# Patient Record
Sex: Male | Born: 1960 | Race: Black or African American | Hispanic: No | Marital: Married | Smoking: Never smoker
Health system: Southern US, Community
[De-identification: ages and names within clinical notes are randomized; demographics above are authoritative.]

## PROBLEM LIST (undated history)

## (undated) DIAGNOSIS — I10 Essential (primary) hypertension: Secondary | ICD-10-CM

---

## 1994-12-14 HISTORY — PX: VASECTOMY: SHX75

## 2010-05-22 ENCOUNTER — Encounter: Admission: RE | Admit: 2010-05-22 | Discharge: 2010-05-22 | Payer: Self-pay | Admitting: Internal Medicine

## 2011-05-24 IMAGING — CT CT HEAD W/O CM
2 series · 15 of 30 positions shown, 19 images · non-contrast
Comparison: None.

CLINICAL DATA: Right-sided headaches and dizziness.

CT HEAD WITHOUT CONTRAST
TECHNIQUE: Contiguous axial images were obtained from the base of
the skull through the vertex without contrast.

[Series 3: head bone · axial · 0.49mm/px · z∈[+26,+46]mm · 2 of 32 slices shown]
[im 3/32  bone]
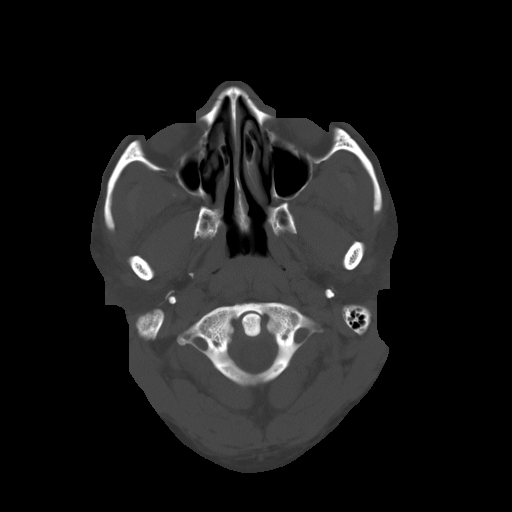
[im 7/32  bone]
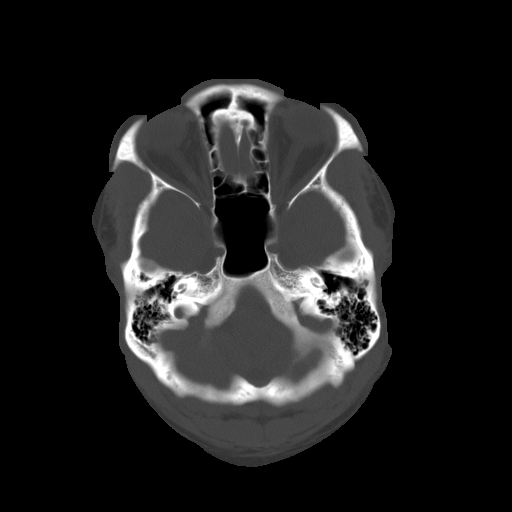

[Series 32: 3d filtered head w/o · axial · non-contrast · 0.49mm/px · z∈[+26,+158]mm · 13 of 32 slices shown, 17 images]
[im 3/32  brain]
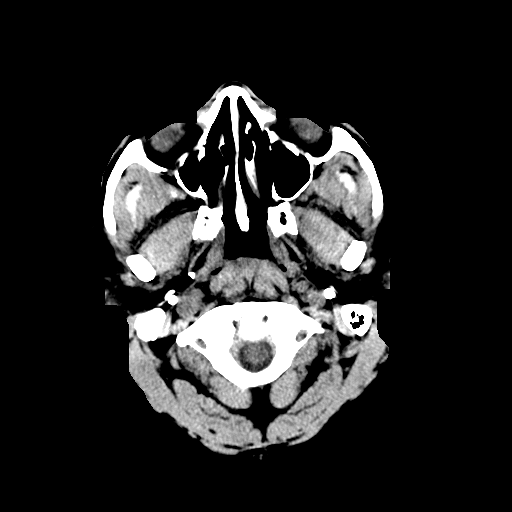
[im 3/32  bone]
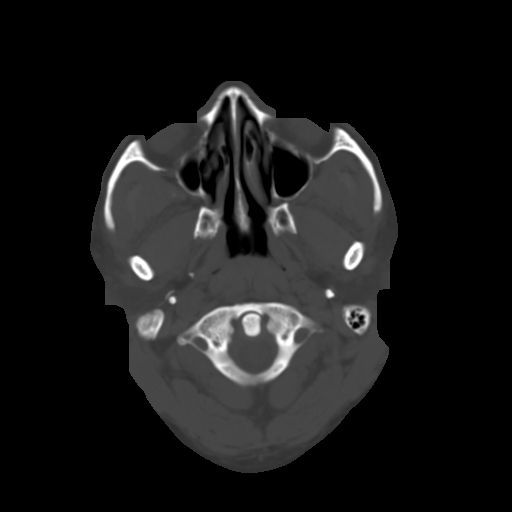
[im 5/32  brain]
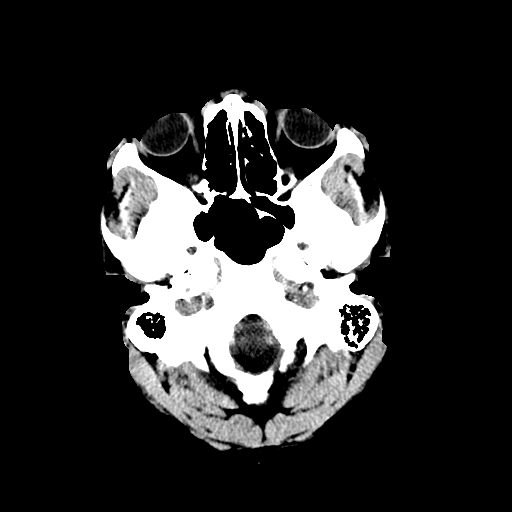
[im 7/32  brain]
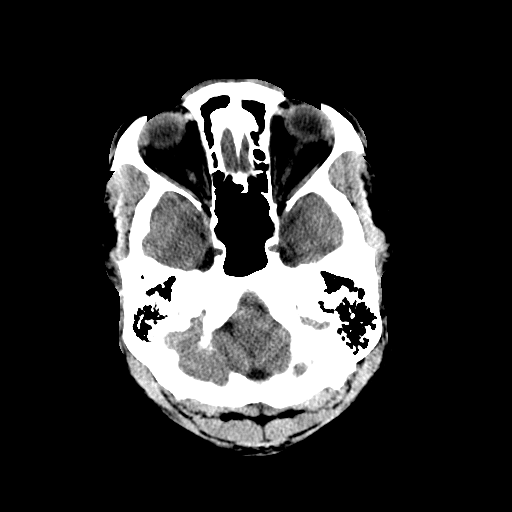
[im 9/32  brain]
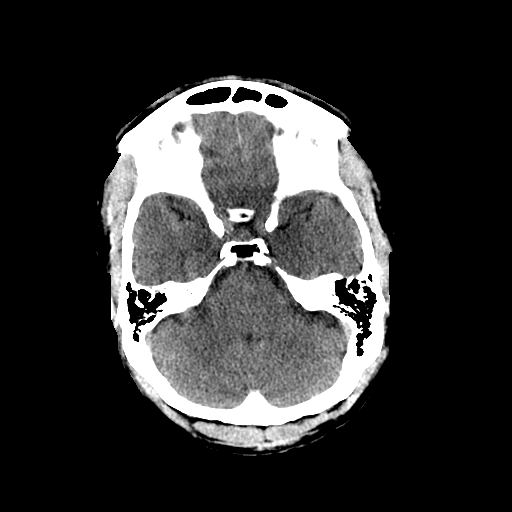
[im 12/32  brain]
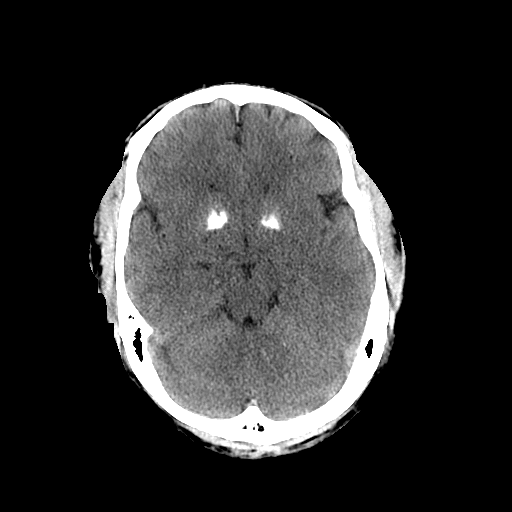
[im 12/32  bone]
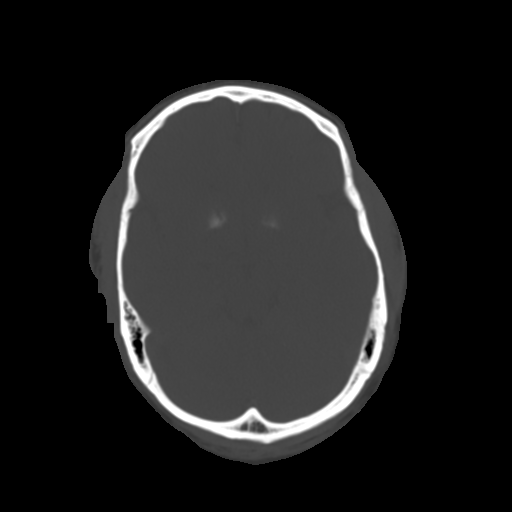
[im 14/32  brain]
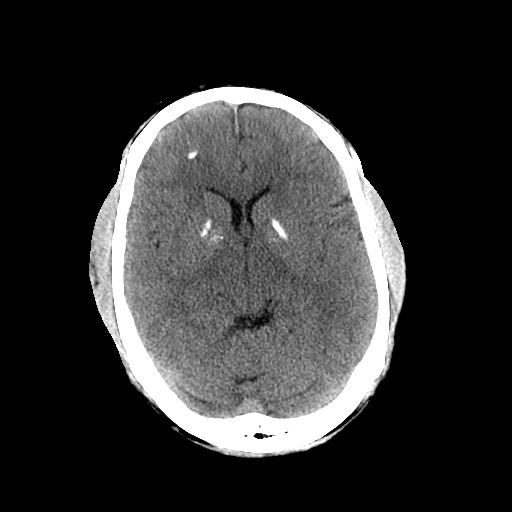
[im 16/32  brain]
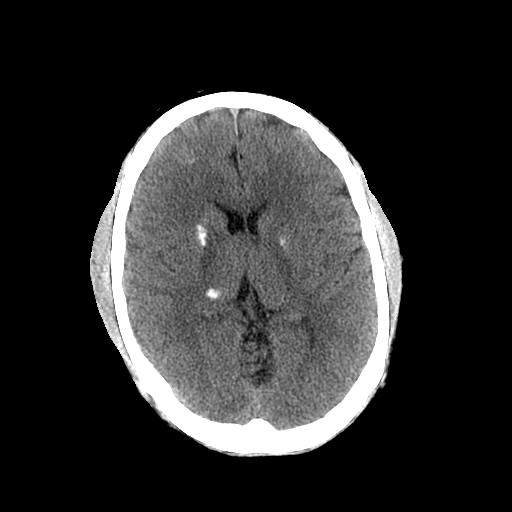
[im 18/32  brain]
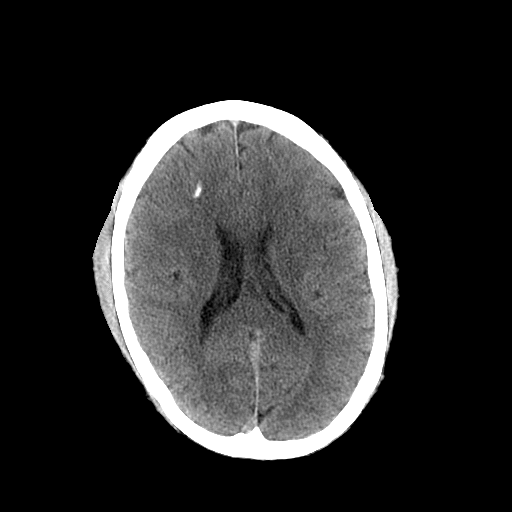
[im 20/32  brain]
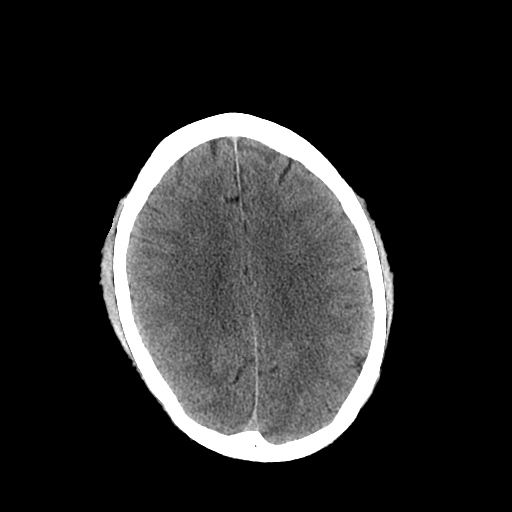
[im 20/32  bone]
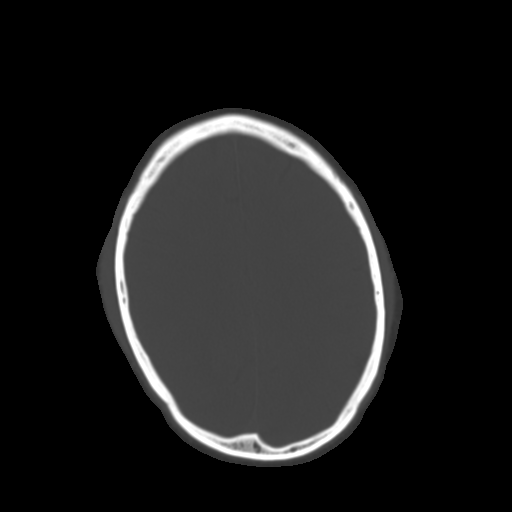
[im 23/32  brain]
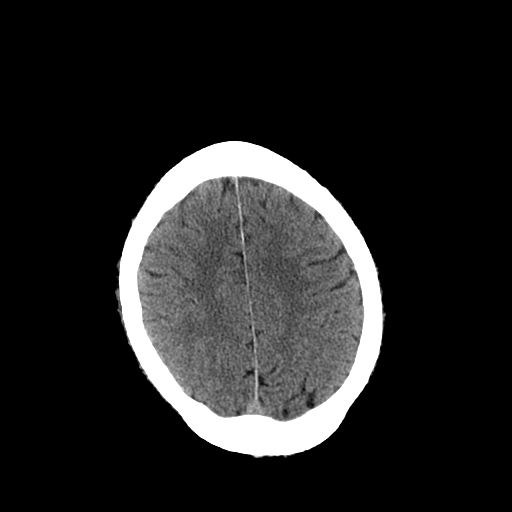
[im 25/32  brain]
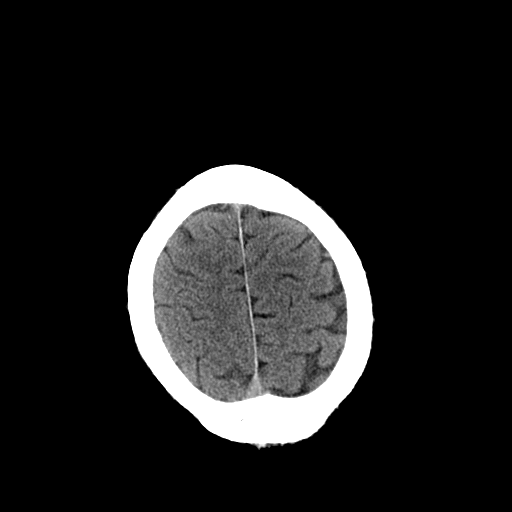
[im 27/32  brain]
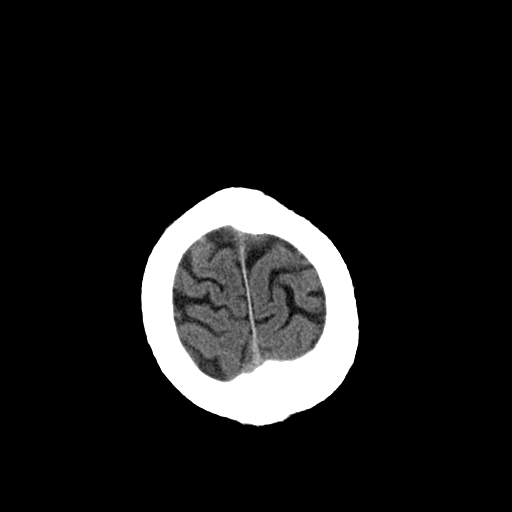
[im 29/32  brain]
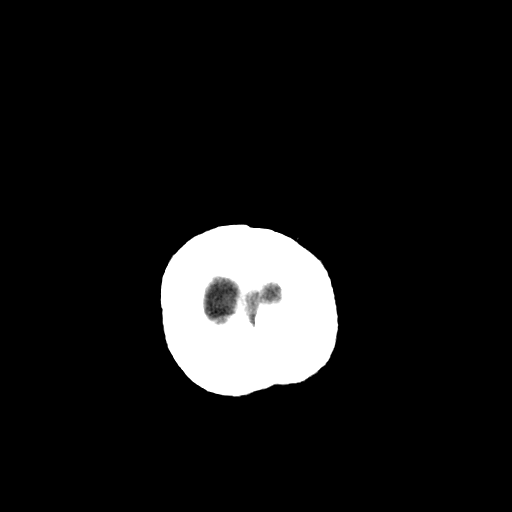
[im 29/32  bone]
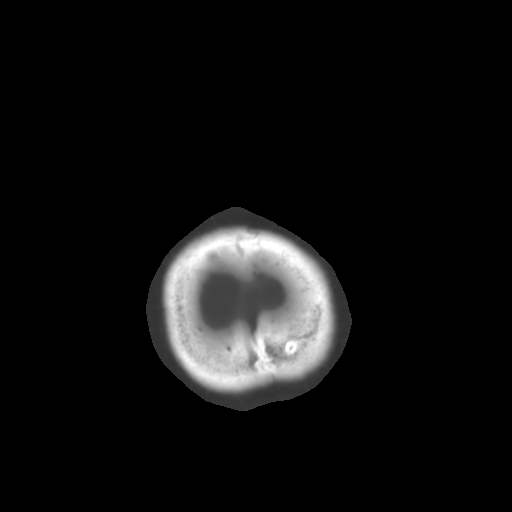

[15 of 30 positions shown; findings below may reference images not displayed]

FINDINGS: Ventricle size is normal.  There is no intracranial
hemorrhage.  No acute infarct is present.

There is prominent calcification in the basal ganglia bilaterally
involving the globus pallidus and putamen.  This is slightly more
prominent on the right.  There is also a focus of calcification in
the right thalamus and two additional foci of calcification in the
right frontal white matter.  These are not associated with mass or
edema and are most likely chronic.

The calvarium is intact.
IMPRESSION: The patient has multiple areas of calcium deposition in the brain.
This may be related to metabolic disorders such as
hyperparathyroidism or hypoparathyroidism.  Correlation with
parathyroid hormone level and calcium levels is suggested.  This
could also  be due to chronic infection or toxin exposure.

## 2012-06-14 ENCOUNTER — Emergency Department (HOSPITAL_COMMUNITY)
Admission: EM | Admit: 2012-06-14 | Discharge: 2012-06-14 | Disposition: A | Discharge status: Home / Self Care | Payer: 59 | Attending: Emergency Medicine | Admitting: Emergency Medicine

## 2012-06-14 ENCOUNTER — Emergency Department (HOSPITAL_COMMUNITY): Payer: 59

## 2012-06-14 ENCOUNTER — Encounter (HOSPITAL_COMMUNITY): Payer: Self-pay | Admitting: *Deleted

## 2012-06-14 DIAGNOSIS — J3489 Other specified disorders of nose and nasal sinuses: Secondary | ICD-10-CM | POA: Insufficient documentation

## 2012-06-14 DIAGNOSIS — R05 Cough: Secondary | ICD-10-CM

## 2012-06-14 DIAGNOSIS — H9209 Otalgia, unspecified ear: Secondary | ICD-10-CM | POA: Insufficient documentation

## 2012-06-14 DIAGNOSIS — R059 Cough, unspecified: Secondary | ICD-10-CM | POA: Insufficient documentation

## 2012-06-14 DIAGNOSIS — R6883 Chills (without fever): Secondary | ICD-10-CM | POA: Insufficient documentation

## 2012-06-14 DIAGNOSIS — I10 Essential (primary) hypertension: Secondary | ICD-10-CM | POA: Insufficient documentation

## 2012-06-14 HISTORY — DX: Essential (primary) hypertension: I10

## 2012-06-14 MED ORDER — DIPHENHYDRAMINE HCL 25 MG PO CAPS: 25.0000 mg | ORAL_CAPSULE | Freq: Four times a day (QID) | ORAL | Status: DC | PRN

## 2012-06-14 MED ORDER — BENZONATATE 100 MG PO CAPS: 100.0000 mg | ORAL_CAPSULE | Freq: Three times a day (TID) | ORAL | Status: AC

## 2012-06-14 MED ORDER — AZITHROMYCIN 250 MG PO TABS: 250.0000 mg | ORAL_TABLET | Freq: Every day | ORAL | Status: AC

## 2012-06-14 NOTE — ED Notes (Signed)
Patient transported to X-ray.  Radiology notified that pt may be returned to [facility] after xray

## 2012-06-14 NOTE — Discharge Instructions (Signed)
Dylan Pace we will treat your cough and fever with a zpack today. Take benadryl with this for post nasal drip.  Benadryl will also help you sleep.  Follow up with your pcp this week especially if you are not better.  Take ibuprofen as needed for pain and fever.   Cough, Adult  A cough is a reflex. It helps you clear your throat and airways. A cough can help heal your body. A cough can last 2 or 3 weeks (acute) or may last more than 8 weeks (chronic). Some common causes of a cough can include an infection, allergy, or a cold. HOME CARE  Only take medicine as told by your doctor.   If given, take your medicines (antibiotics) as told. Finish them even if you start to feel better.   Use a cold steam vaporizer or humidier in your home. This can help loosen thick spit (secretions).   Sleep so you are almost sitting up (semi-upright). Use pillows to do this. This helps reduce coughing.   Rest as needed.   Stop smoking if you smoke.  GET HELP RIGHT AWAY IF:  You have yellowish-white fluid (pus) in your thick spit.   Your cough gets worse.   Your medicine does not reduce coughing, and you are losing sleep.   You cough up blood.   You have trouble breathing.   Your pain gets worse and medicine does not help.   You have a fever.  MAKE SURE YOU:   Understand these instructions.   Will watch your condition.   Will get help right away if you are not doing well or get worse.  Document Released: 08/13/2011 Document Revised: 11/19/2011 Document Reviewed: 08/13/2011 [facility] Patient Information 2012 [place], [place].Cough, Adult  A cough is a reflex. It helps you clear your throat and airways. A cough can help heal your body. A cough can last 2 or 3 weeks (acute) or may last more than 8 weeks (chronic). Some common causes of a cough can include an infection, allergy, or a cold. HOME CARE  Only take medicine as told by your doctor.   If given, take your medicines (antibiotics) as told.  Finish them even if you start to feel better.   Use a cold steam vaporizer or humidier in your home. This can help loosen thick spit (secretions).   Sleep so you are almost sitting up (semi-upright). Use pillows to do this. This helps reduce coughing.   Rest as needed.   Stop smoking if you smoke.  GET HELP RIGHT AWAY IF:  You have yellowish-white fluid (pus) in your thick spit.   Your cough gets worse.   Your medicine does not reduce coughing, and you are losing sleep.   You cough up blood.   You have trouble breathing.   Your pain gets worse and medicine does not help.   You have a fever.  MAKE SURE YOU:   Understand these instructions.   Will watch your condition.   Will get help right away if you are not doing well or get worse.  Document Released: 08/13/2011 Document Revised: 11/19/2011 Document Reviewed: 08/13/2011 [facility] Patient Information 2012 [place], [place].

## 2012-06-14 NOTE — ED Notes (Signed)
The pt had flu-like symptoms last week and since then he has had a hacking cough non-productive.  Headache and not able to sleep for coughing

## 2012-06-18 NOTE — ED Provider Notes (Signed)
History     CSN: [id number]  Arrival date & time 06/14/12  0009   First MD Initiated Contact with Patient 06/14/12 0208      Chief Complaint  Patient presents with  . Cough    (Consider location/radiation/quality/duration/timing/severity/associated sxs/prior treatment) Patient is a 51 y.o. male presenting with cough. The history is provided by the patient. No language interpreter was used.  Cough This is a new problem. The current episode started more than 1 week ago. The problem occurs hourly. The problem has not changed since onset.The cough is non-productive. The maximum temperature recorded prior to his arrival was 100 to 100.9 F. Associated symptoms include chills, ear pain and rhinorrhea. Pertinent negatives include no sore throat, no shortness of breath and no wheezing. He is not a smoker. His past medical history does not include bronchitis, pneumonia or COPD.    Past Medical History  Diagnosis Date  . Hypertension     History reviewed. No pertinent past surgical history.  No family history on file.  History  Substance Use Topics  . Smoking status: Never Smoker   . Smokeless tobacco: Not on file  . Alcohol Use: No      Review of Systems  Constitutional: Positive for chills.  HENT: Positive for ear pain and rhinorrhea. Negative for sore throat.   Eyes: Negative.   Respiratory: Positive for cough. Negative for shortness of breath and wheezing.   Cardiovascular: Negative.   Gastrointestinal: Negative.   Neurological: Negative.   Psychiatric/Behavioral: Negative.   All other systems reviewed and are negative.    Allergies  Amoxicillin and Penicillins  Home Medications   Current Outpatient Rx  Name Route Sig Dispense Refill  . CETIRIZINE HCL 10 MG PO TABS Oral Take 10 mg by mouth daily.    Marland Kitchen OVER THE COUNTER MEDICATION Oral Take 20 mLs by mouth every 6 (six) hours as needed. otc medication for cough    . AZITHROMYCIN 250 MG PO TABS Oral Take 1 tablet (250  mg total) by mouth daily. 6 tablet 0    Take 2 pills day one and one pill every day until  ...  . BENZONATATE 100 MG PO CAPS Oral Take 1 capsule (100 mg total) by mouth every 8 (eight) hours. 21 capsule 0  . DIPHENHYDRAMINE HCL 25 MG PO CAPS Oral Take 1 capsule (25 mg total) by mouth every 6 (six) hours as needed for itching. 30 capsule 0    BP 125/82  Pulse 82  Temp 100.1 F (37.8 C) (Oral)  Resp 18  SpO2 96%  Physical Exam  Nursing note and vitals reviewed. Constitutional: He is oriented to person, place, and time. He appears well-developed and well-nourished.  HENT:  Head: Normocephalic.  Right Ear: Tympanic membrane normal.  Left Ear: Tympanic membrane is erythematous and bulging.  Nose: Rhinorrhea present.  Mouth/Throat: Uvula is midline. Posterior oropharyngeal erythema present.    Eyes: Conjunctivae and EOM are normal. Pupils are equal, round, and reactive to light.  Neck: Normal range of motion. Neck supple.  Cardiovascular: Normal rate.   Pulmonary/Chest: Effort normal.  Abdominal: Soft.  Musculoskeletal: Normal range of motion.  Neurological: He is alert and oriented to person, place, and time.  Skin: Skin is warm and dry.  Psychiatric: He has a normal mood and affect.    ED Course  Procedures (including critical care time)  Labs Reviewed - No data to display No results found.   1. Cough       MDM  Cough with upper respiratory symptoms x 1 week.  Rx for azithromycin, benadryl and tessalon pearls.  Chest x-ray shows no pneumonia.         Remi Haggard, NP 06/18/12 1130

## 2012-06-21 NOTE — ED Provider Notes (Signed)
Medical screening examination/treatment/procedure(s) were performed by non-physician practitioner and as supervising physician I was immediately available for consultation/collaboration.   Dajuan Turnley Y. Ranie Chinchilla, MD 06/21/12 0742 

## 2015-04-10 ENCOUNTER — Ambulatory Visit (INDEPENDENT_AMBULATORY_CARE_PROVIDER_SITE_OTHER): Payer: 59 | Admitting: Sports Medicine

## 2015-04-10 VITALS — BP 138/82 | HR 80 | Temp 98.2°F | Ht 74.5 in | Wt 268.1 lb

## 2015-04-10 DIAGNOSIS — G44209 Tension-type headache, unspecified, not intractable: Secondary | ICD-10-CM

## 2015-04-10 DIAGNOSIS — F410 Panic disorder [episodic paroxysmal anxiety] without agoraphobia: Secondary | ICD-10-CM

## 2015-04-10 DIAGNOSIS — F430 Acute stress reaction: Principal | ICD-10-CM

## 2015-04-10 NOTE — Progress Notes (Signed)
  Dylan ChessBarry V Pace - 54 y.o. male MRN [id number]  Date of birth: 12/13/1961  SUBJECTIVE:  Including CC & ROS.  Patient is a 54 year old African-American male who presents today with new onset throbbing headache, increased heart rate, dizziness, feeling of being panicked her anxious following a stressful day at work. Patient reports that he came home from work and had a throbbing headache on the superior aspect of his head. He had some dizziness which has since improved. He also had increase in heart rate which has since improved. He's had no episodes of panic attack in the past but has had some anxiety in the past. Reports a very stressful day at work but did not provide any details of what happened. Denies any associated neck pain, blurred vision, gait abnormalities, muscle weakness, chest pain, shortness of breath, dyspnea on exertion, lightheadedness or vertigo. Patient did not take any medications to help relieve his headache or dizziness. He reports that since being in the office and resting on the exam table his symptoms have improved.   ROS:  Constitutional:  No fever, chills, or fatigue.  Respiratory:  No shortness of breath, cough, or wheezing Cardiovascular:   Heart racing, No palpitations, no chest pain or syncope Gastrointestinal:  No nausea, no abdominal pain Review of systems otherwise negative except for what is stated in HPI  HISTORY: Past Medical, Surgical, Social, and Family History Reviewed & Updated per EMR. Pertinent Historical Findings include: Hypertension fairly well controlled on losartan 25 mg  PHYSICAL EXAM:  VS: BP:138/82 mmHg  HR:80bpm  TEMP:98.2 F (36.8 C)(Oral)  RESP:97 %  HT:6' 2.5" (189.2 cm)   WT:268 lb 2 oz (121.621 kg)  BMI:34 PHYSICAL EXAM: General:  Alert and oriented, No acute distress.   HENT:  Normocephalic, Oral mucosa is moist.   Respiratory:  Lungs are clear to auscultation, Respirations are non-labored, Symmetrical chest wall expansion.     Cardiovascular:  Normal rate, Regular rhythm, No murmur, Good pulses equal in all extremities, No edema.   Gastrointestinal:  Soft, Non-tender, Non-distended, Normal bowel sounds, No organomegaly.   Integumentary:  Warm, Dry, No rash.   Neurologic:  Alert, Oriented, No focal defects, pupils are equal and reactive to light and accommodating, normal convergence, negative nystagmus with saccades, normal eye motion. Cranial nerves II through XII intact. Neuromuscular normal sensation and motor function in bilateral upper and lower extremities. Psychiatric:  Cooperative, Appropriate mood & affect.    ASSESSMENT & PLAN:  Impression: Headache provoked by stress reaction Acute panic attack related to stress reaction  Recommendations: -Discuss with patient that based on his clinical history and examination today suspect his provoking symptoms of headache, anxiety, and heart racing or related to a stress reaction. -Patient has no clinical symptoms of a regular rhythm, no persistent dizziness, normal pulse ox, normal heart rate, normal blood pressure today. -Encouraged patient that we can treat his headache acutely with anti-inflammatories and anti-nausea medicine that will also help with his anxiety. Patient declined treatment feeling that the symptoms would likely resolve on their own. -We discussed the merits of getting an EKG patient would not like to proceed with this given that his symptoms are improving and he has no history of chronic palpitations. Patient was reassured there are no alarming symptoms concerning for stroke, heart attack, arrhythmia today. Patient was provided with warning signs of low acutely worsening symptoms. He verbalized understanding of plan.

## 2017-04-16 ENCOUNTER — Ambulatory Visit (HOSPITAL_COMMUNITY)
Admission: EM | Admit: 2017-04-16 | Discharge: 2017-04-16 | Disposition: A | Discharge status: Home / Self Care | Payer: Commercial Managed Care - HMO | Attending: Family Medicine | Admitting: Family Medicine

## 2017-04-16 ENCOUNTER — Encounter (HOSPITAL_COMMUNITY): Payer: Self-pay | Admitting: Emergency Medicine

## 2017-04-16 DIAGNOSIS — R059 Cough, unspecified: Secondary | ICD-10-CM

## 2017-04-16 DIAGNOSIS — R05 Cough: Secondary | ICD-10-CM

## 2017-04-16 DIAGNOSIS — G4733 Obstructive sleep apnea (adult) (pediatric): Secondary | ICD-10-CM | POA: Diagnosis not present

## 2017-04-16 DIAGNOSIS — J209 Acute bronchitis, unspecified: Secondary | ICD-10-CM

## 2017-04-16 MED ORDER — BENZONATATE 100 MG PO CAPS: 200.0000 mg | ORAL_CAPSULE | Freq: Three times a day (TID) | ORAL | 0 refills | Status: DC | PRN

## 2017-04-16 MED ORDER — IPRATROPIUM BROMIDE 0.06 % NA SOLN: 2.0000 | Freq: Four times a day (QID) | NASAL | 0 refills | Status: DC

## 2017-04-16 MED ORDER — AZITHROMYCIN 250 MG PO TABS: 250.0000 mg | ORAL_TABLET | Freq: Every day | ORAL | 0 refills | Status: DC

## 2017-04-16 NOTE — ED Provider Notes (Signed)
CSN: [id number]     Arrival date & time 04/16/17  1904 History   None    Chief Complaint  Patient presents with  . Cough   (Consider location/radiation/quality/duration/timing/severity/associated sxs/prior Treatment) Patient c/o fever and cough x 2 days   The history is provided by the patient.  Cough  Cough characteristics:  Productive Sputum characteristics:  White Severity:  Moderate Onset quality:  Sudden Duration:  3 days Timing:  Constant Relieved by:  Nothing Worsened by:  Nothing Ineffective treatments:  None tried Associated symptoms: chills, fever, rhinorrhea, shortness of breath, sinus congestion, sore throat and wheezing     Past Medical History:  Diagnosis Date  . Hypertension    History reviewed. No pertinent surgical history. History reviewed. No pertinent family history. Social History  Substance Use Topics  . Smoking status: Never Smoker  . Smokeless tobacco: Not on file  . Alcohol use No    Review of Systems  Constitutional: Positive for chills and fever.  HENT: Positive for rhinorrhea and sore throat.   Eyes: Negative.   Respiratory: Positive for cough, shortness of breath and wheezing.   Cardiovascular: Negative.   Gastrointestinal: Negative.   Endocrine: Negative.   Musculoskeletal: Negative.     Allergies  Amoxicillin and Penicillins  Home Medications   Prior to Admission medications   Medication Sig Start Date End Date Taking? Authorizing Provider  losartan (COZAAR) 25 MG tablet Take 25 mg by mouth daily.   Yes Historical Provider, MD  azithromycin (ZITHROMAX) 250 MG tablet Take 1 tablet (250 mg total) by mouth daily. Take first 2 tablets together, then 1 every day until finished. 04/16/17   Deatra CanterWilliam J Gerarda Conklin, FNP  benzonatate (TESSALON) 100 MG capsule Take 2 capsules (200 mg total) by mouth 3 (three) times daily as needed for cough. 04/16/17   Deatra CanterWilliam J Giovana Faciane, FNP  ipratropium (ATROVENT) 0.06 % nasal spray Place 2 sprays into both nostrils  4 (four) times daily. 04/16/17   Deatra CanterWilliam J Tresia Revolorio, FNP   Meds Ordered and Administered this Visit  Medications - No data to display  BP (!) 155/93 (BP Location: Right Arm)   Pulse 87   Temp 99.9 F (37.7 C) (Oral)   Resp 20   SpO2 96%  No data found.   Physical Exam  Constitutional: He is oriented to person, place, and time. He appears well-developed and well-nourished.  HENT:  Head: Normocephalic and atraumatic.  Right Ear: External ear normal.  Left Ear: External ear normal.  Mouth/Throat: Oropharynx is clear and moist.  Eyes: Conjunctivae and EOM are normal. Pupils are equal, round, and reactive to light.  Neck: Normal range of motion. Neck supple.  Cardiovascular: Normal rate, regular rhythm and normal heart sounds.   Pulmonary/Chest: Effort normal and breath sounds normal.  Abdominal: Soft. Bowel sounds are normal.  Musculoskeletal: Normal range of motion.  Neurological: He is alert and oriented to person, place, and time.  Nursing note and vitals reviewed.   Urgent Care Course     Procedures (including critical care time)  Labs Review Labs Reviewed - No data to display  Imaging Review No results found.   Visual Acuity Review  Right Eye Distance:   Left Eye Distance:   Bilateral Distance:    Right Eye Near:   Left Eye Near:    Bilateral Near:         MDM   1. Cough   2. Acute bronchitis, unspecified organism    Zpak Tessalon perles Ipratropium  Push  po fluids, rest, tylenol and motrin otc prn as directed for fever, arthralgias, and myalgias.  Follow up prn if sx's continue or persist.    Deatra Canter, FNP 04/16/17 2201

## 2017-04-16 NOTE — ED Triage Notes (Signed)
[facility] a [facility] of a fever and cough x 2 days.

## 2017-04-22 DIAGNOSIS — R05 Cough: Secondary | ICD-10-CM | POA: Diagnosis not present

## 2017-04-22 DIAGNOSIS — J309 Allergic rhinitis, unspecified: Secondary | ICD-10-CM | POA: Diagnosis not present

## 2017-05-19 DIAGNOSIS — Z0000 Encounter for general adult medical examination without abnormal findings: Secondary | ICD-10-CM | POA: Diagnosis not present

## 2017-05-19 DIAGNOSIS — Z1159 Encounter for screening for other viral diseases: Secondary | ICD-10-CM | POA: Diagnosis not present

## 2017-05-19 DIAGNOSIS — Z136 Encounter for screening for cardiovascular disorders: Secondary | ICD-10-CM | POA: Diagnosis not present

## 2017-08-24 DIAGNOSIS — I10 Essential (primary) hypertension: Secondary | ICD-10-CM | POA: Diagnosis not present

## 2018-04-18 DIAGNOSIS — R0981 Nasal congestion: Secondary | ICD-10-CM | POA: Diagnosis not present

## 2018-06-09 DIAGNOSIS — E785 Hyperlipidemia, unspecified: Secondary | ICD-10-CM | POA: Diagnosis not present

## 2018-06-09 DIAGNOSIS — Z0000 Encounter for general adult medical examination without abnormal findings: Secondary | ICD-10-CM | POA: Diagnosis not present

## 2018-12-21 DIAGNOSIS — N182 Chronic kidney disease, stage 2 (mild): Secondary | ICD-10-CM | POA: Diagnosis not present

## 2018-12-21 DIAGNOSIS — I10 Essential (primary) hypertension: Secondary | ICD-10-CM | POA: Diagnosis not present

## 2018-12-21 DIAGNOSIS — J309 Allergic rhinitis, unspecified: Secondary | ICD-10-CM | POA: Diagnosis not present

## 2019-01-07 ENCOUNTER — Other Ambulatory Visit: Payer: Self-pay

## 2019-01-07 ENCOUNTER — Encounter (HOSPITAL_COMMUNITY): Payer: Self-pay

## 2019-01-07 ENCOUNTER — Observation Stay (HOSPITAL_COMMUNITY)
Admission: EM | Admit: 2019-01-07 | Discharge: 2019-01-08 | Disposition: A | Discharge status: Home / Self Care | Payer: 59 | Attending: Internal Medicine | Admitting: Internal Medicine

## 2019-01-07 ENCOUNTER — Emergency Department (HOSPITAL_COMMUNITY): Payer: 59

## 2019-01-07 DIAGNOSIS — R072 Precordial pain: Secondary | ICD-10-CM | POA: Diagnosis not present

## 2019-01-07 DIAGNOSIS — R51 Headache: Secondary | ICD-10-CM | POA: Diagnosis not present

## 2019-01-07 DIAGNOSIS — Z79899 Other long term (current) drug therapy: Secondary | ICD-10-CM | POA: Diagnosis not present

## 2019-01-07 DIAGNOSIS — R079 Chest pain, unspecified: Secondary | ICD-10-CM | POA: Diagnosis present

## 2019-01-07 DIAGNOSIS — I10 Essential (primary) hypertension: Secondary | ICD-10-CM | POA: Diagnosis not present

## 2019-01-07 DIAGNOSIS — R0789 Other chest pain: Secondary | ICD-10-CM | POA: Diagnosis not present

## 2019-01-07 LAB — CBC
HCT: 45.4 % (ref 39.0–52.0)
Hemoglobin: 15.1 g/dL (ref 13.0–17.0)
MCH: 28 pg (ref 26.0–34.0)
MCHC: 33.3 g/dL (ref 30.0–36.0)
MCV: 84.2 fL (ref 80.0–100.0)
NRBC: 0 % (ref 0.0–0.2)
PLATELETS: 233 10*3/uL (ref 150–400)
RBC: 5.39 MIL/uL (ref 4.22–5.81)
RDW: 12.2 % (ref 11.5–15.5)
WBC: 6.2 10*3/uL (ref 4.0–10.5)

## 2019-01-07 LAB — BASIC METABOLIC PANEL
ANION GAP: 12 (ref 5–15)
BUN: 24 mg/dL — ABNORMAL HIGH (ref 6–20)
CALCIUM: 9.5 mg/dL (ref 8.9–10.3)
CO2: 25 mmol/L (ref 22–32)
CREATININE: 1.32 mg/dL — AB (ref 0.61–1.24)
Chloride: 102 mmol/L (ref 98–111)
GFR calc Af Amer: >60 mL/min (ref >60)
GFR, EST NON AFRICAN AMERICAN: 59 mL/min — AB (ref >60)
Glucose, Bld: 90 mg/dL (ref 70–99)
Potassium: 3.8 mmol/L (ref 3.5–5.1)
SODIUM: 139 mmol/L (ref 135–145)

## 2019-01-07 LAB — D-DIMER, QUANTITATIVE (NOT AT ARMC): D DIMER QUANT: 0.27 ug{FEU}/mL (ref 0.00–0.50)

## 2019-01-07 LAB — TROPONIN I: Troponin I: <0.03 ng/mL (ref <0.03)

## 2019-01-07 LAB — I-STAT TROPONIN, ED: TROPONIN I, POC: 0.03 ng/mL (ref 0.00–0.08)

## 2019-01-07 MED ORDER — SODIUM CHLORIDE 0.9% FLUSH
3.0000 mL | Freq: Once | INTRAVENOUS | Status: AC
  Administered 2019-01-07: 3 mL via INTRAVENOUS

## 2019-01-07 MED ORDER — LOSARTAN POTASSIUM 25 MG PO TABS
25.0000 mg | ORAL_TABLET | Freq: Every day | ORAL | Status: DC
  Administered 2019-01-08: 25 mg via ORAL
  Filled 2019-01-07: qty 1

## 2019-01-07 MED ORDER — ENOXAPARIN SODIUM 40 MG/0.4ML ~~LOC~~ SOLN: 40.0000 mg | SUBCUTANEOUS | Status: DC

## 2019-01-07 MED ORDER — ONDANSETRON HCL 4 MG/2ML IJ SOLN: 4.0000 mg | Freq: Four times a day (QID) | INTRAMUSCULAR | Status: DC | PRN

## 2019-01-07 MED ORDER — NITROGLYCERIN 0.4 MG SL SUBL
0.4000 mg | SUBLINGUAL_TABLET | SUBLINGUAL | Status: DC | PRN
  Administered 2019-01-07: 0.4 mg via SUBLINGUAL
  Filled 2019-01-07: qty 1

## 2019-01-07 MED ORDER — ACETAMINOPHEN 325 MG PO TABS: 650.0000 mg | ORAL_TABLET | ORAL | Status: DC | PRN

## 2019-01-07 NOTE — H&P (Signed)
History and Physical    DAYSHON BEEKMAN KGY:[id number] DOB: 11-21-1961 DOA: 01/07/2019  PCP: Georgann Housekeeper, MD  Patient coming from: Home  I have personally briefly reviewed patient's [facility] medical [facility]  Chief Complaint: CP  HPI: ADDIEL GLEISSNER is a 58 y.o. male with medical history significant of HTN.  Patient presents to the ED with c/o R sided CP.  Onset this AM at 0700.  Checked BP at home, was 200 systolic, went to [facility] checked BP there and it was 220.  He didn't believe it so went to 2nd [facility], BP checked and was still 190 or so.  EMS called.  On Losartan only.  Usually takes it except for 2 weeks over Xmas.  ASA 325 by EMS.   ED Course: BP 160s systolic initially in ED, CP resolved and BP 131 now after NTG.  EKG with inferior Q waves and TWI.   Review of Systems: As per HPI otherwise 10 point review of systems negative.   Past Medical History:  Diagnosis Date  . Hypertension     No past surgical history on file.   reports that he has never smoked. He does not have any smokeless tobacco history on file. He reports that he does not drink alcohol. No history on file for drug.  Allergies  Allergen Reactions  . Amoxicillin Rash  . Penicillins Rash    No family history on file. No FHx of early onset CAD  Prior to Admission medications   Medication Sig Start Date End Date Taking? Authorizing Provider  losartan (COZAAR) 25 MG tablet Take 25 mg by mouth daily.   Yes [provider]    Physical Exam: Vitals:   01/07/19 1700 01/07/19 1745 01/07/19 1902 01/07/19 1915  BP: (!) 155/96 140/86 (!) 148/88 113/87  Pulse:  (!) 57 63 70  Resp:  13 14 (!) 21  Temp:      TempSrc:      SpO2:  96% 96% 95%  Weight:      Height:        Constitutional: NAD, calm, comfortable Eyes: PERRL, lids and conjunctivae normal ENMT: Mucous membranes are moist. Posterior pharynx clear of any exudate or lesions.Normal dentition.  Neck: normal, supple, no  masses, no thyromegaly Respiratory: clear to auscultation bilaterally, no wheezing, no crackles. Normal respiratory effort. No accessory muscle use.  Cardiovascular: Regular rate and rhythm, no murmurs / rubs / gallops. No extremity edema. 2+ pedal pulses. No carotid bruits.  Abdomen: no tenderness, no masses palpated. No hepatosplenomegaly. Bowel sounds positive.  Musculoskeletal: no clubbing / cyanosis. No joint deformity upper and lower extremities. Good ROM, no contractures. Normal muscle tone.  Skin: no rashes, lesions, ulcers. No induration Neurologic: CN 2-12 grossly intact. Sensation intact, DTR normal. Strength 5/5 in all 4.  Psychiatric: Normal judgment and insight. Alert and oriented x 3. Normal mood.    Labs on Admission: I have personally reviewed following labs and imaging studies  CBC: Recent Labs  Lab 01/07/19 1625  WBC 6.2  HGB 15.1  HCT 45.4  MCV 84.2  PLT 233   Basic Metabolic Panel: Recent Labs  Lab 01/07/19 1625  NA 139  K 3.8  CL 102  CO2 25  GLUCOSE 90  BUN 24*  CREATININE 1.32*  CALCIUM 9.5   GFR: Estimated Creatinine Clearance: 87.3 mL/min (A) (by C-G formula based on SCr of 1.32 mg/dL (H)). Liver Function Tests: No results for input(s): AST, ALT, ALKPHOS, BILITOT, PROT,  ALBUMIN in the last 168 hours. No results for input(s): LIPASE, AMYLASE in the last 168 hours. No results for input(s): AMMONIA in the last 168 hours. Coagulation Profile: No results for input(s): INR, PROTIME in the last 168 hours. Cardiac Enzymes: No results for input(s): CKTOTAL, CKMB, CKMBINDEX, TROPONINI in the last 168 hours. BNP (last 3 results) No results for input(s): PROBNP in the last 8760 hours. HbA1C: No results for input(s): HGBA1C in the last 72 hours. CBG: No results for input(s): GLUCAP in the last 168 hours. Lipid Profile: No results for input(s): CHOL, HDL, LDLCALC, TRIG, CHOLHDL, LDLDIRECT in the last 72 hours. Thyroid Function Tests: No results for  input(s): TSH, T4TOTAL, FREET4, T3FREE, THYROIDAB in the last 72 hours. Anemia Panel: No results for input(s): VITAMINB12, FOLATE, FERRITIN, TIBC, IRON, RETICCTPCT in the last 72 hours. Urine analysis: No results found for: COLORURINE, APPEARANCEUR, LABSPEC, PHURINE, GLUCOSEU, HGBUR, BILIRUBINUR, KETONESUR, PROTEINUR, UROBILINOGEN, NITRITE, LEUKOCYTESUR  Radiological Exams on Admission: Dg Chest 2 View  Result Date: 01/07/2019 CLINICAL DATA:  Right-sided chest pain beginning this morning. Hypertension. EXAM: CHEST - 2 VIEW COMPARISON:  06/14/2012 FINDINGS: The heart size and mediastinal contours are within normal limits. Both lungs are clear. The visualized skeletal structures are unremarkable. IMPRESSION: No active cardiopulmonary disease. Electronically Signed   By: Myles RosenthalJohn  Stahl M.D.   On: 01/07/2019 17:26    EKG: Independently reviewed.  Assessment/Plan Principal Problem:   Chest pain, rule out acute myocardial infarction Active Problems:   HTN (hypertension)    1. CP r/o - 1. CP obs pathway 2. Serial trops 3. Tele monitor 4. NPO after MN 5. Cards eval in AM 6. Checking 2d echo given inferior wall Q waves, looks like he may have had prior infarct? 2. HTN - 1. Continue Losartan  DVT prophylaxis: Lovenox Code Status: Full Family Communication: No family in room Disposition Plan: Home after admit Consults called: Message sent to P. Trent for cards eval in AM Admission status: Place in [place]    [place], [place] M. DO [facility] Pager [phone] Only works nights!  If 7AM-7PM, please contact the primary day team physician taking care of patient  [email or web address] Password [email or web address]  01/07/2019, 7:36 PM

## 2019-01-07 NOTE — ED Provider Notes (Signed)
[facility] EM[facility]ENCY [facility] Provider Note   CSN: [id number] Arrival date & time: 01/07/19  1615     History   Chief Complaint Chief Complaint  Patient presents with  . Chest Pain    HPI Dylan Pace is a 58 y.o. male.  Patient with onset of chest pain at 7 this morning.'s right-sided.  Went through to the back.  No shortness of breath no nausea or vomiting.  Never had pain like this before.  Patient states is been 3-5 out of 10.  Patient does have a history of high blood pressure.  His blood pressures been high today.  [facility] [place] [facility] and was [facility] and eventually EMS was called.  Patient states his been compliant with blood pressure meds but did not take it regularly over Christmas but has been taking regularly lately.  Patient states that the pain does move to his back.  It is worse it was 6 out of 10.  Patient was given aspirin by EMS.     Past Medical History:  Diagnosis Date  . Hypertension     There are no active problems to display for this patient.   No past surgical history on file.      Home Medications    Prior to Admission medications   Medication Sig Start Date End Date Taking? Authorizing Provider  losartan (COZAAR) 25 MG tablet Take 25 mg by mouth daily.   Yes [provider]  azithromycin (ZITHROMAX) 250 MG tablet Take 1 tablet (250 mg total) by mouth daily. Take first 2 tablets together, then 1 every day until finished. Patient not taking: Reported on 01/07/2019 04/16/17   Deatra Canterxford, William J, FNP  benzonatate (TESSALON) 100 MG capsule Take 2 capsules (200 mg total) by mouth 3 (three) times daily as needed for cough. Patient not taking: Reported on 01/07/2019 04/16/17   Deatra Canterxford, William J, FNP  ipratropium (ATROVENT) 0.06 % nasal spray Place 2 sprays into both nostrils 4 (four) times daily. Patient not taking: Reported on 01/07/2019 04/16/17   Deatra Canterxford, William J, FNP    Family History No family  history on file.  Social History Social History   Tobacco Use  . Smoking status: Never Smoker  Substance Use Topics  . Alcohol use: No    Alcohol/week: 0.0 standard drinks  . Drug use: Not on file     Allergies   Amoxicillin and Penicillins   Review of Systems Review of Systems  Constitutional: Negative for chills and fever.  HENT: Negative for rhinorrhea and sore throat.   Eyes: Negative for visual disturbance.  Respiratory: Negative for cough and shortness of breath.   Cardiovascular: Positive for chest pain. Negative for leg swelling.  Gastrointestinal: Negative for abdominal pain, diarrhea, nausea and vomiting.  Genitourinary: Negative for dysuria.  Musculoskeletal: Positive for back pain. Negative for neck pain.  Skin: Negative for rash.  Neurological: Negative for dizziness, light-headedness and headaches.  Hematological: Does not bruise/bleed easily.  Psychiatric/Behavioral: Negative for confusion.     Physical Exam Updated Vital Signs BP 140/86   Pulse (!) 57   Temp 98.3 F (36.8 C) (Oral)   Resp 13   Ht 1.88 m (6\' 2" )   Wt 126.6 kg   SpO2 96%   BMI 35.82 kg/m   Physical Exam Vitals signs and nursing note reviewed.  Constitutional:      Appearance: He is well-developed.  HENT:     Head: Normocephalic and atraumatic.  Mouth/Throat:     Mouth: Mucous membranes are moist.  Eyes:     Conjunctiva/sclera: Conjunctivae normal.  Neck:     Musculoskeletal: Neck supple.  Cardiovascular:     Rate and Rhythm: Normal rate and regular rhythm.     Heart sounds: No murmur.  Pulmonary:     Effort: Pulmonary effort is normal. No respiratory distress.     Breath sounds: Normal breath sounds.  Chest:     Chest wall: No tenderness.  Abdominal:     Palpations: Abdomen is soft.     Tenderness: There is no abdominal tenderness.  Musculoskeletal: Normal range of motion.  Skin:    General: Skin is warm and dry.     Capillary Refill: Capillary refill takes  less than 2 seconds.  Neurological:     General: No focal deficit present.     Mental Status: He is alert and oriented to person, place, and time.      ED Treatments / Results  Labs (all labs ordered are listed, but only abnormal results are displayed) Labs Reviewed  BASIC METABOLIC PANEL - Abnormal; Notable for the following components:      Result Value   BUN 24 (*)    Creatinine, Ser 1.32 (*)    GFR calc non Af Amer 59 (*)    All other components within normal limits  CBC  D-DIMER, QUANTITATIVE (NOT AT [facility])  I-STAT TROPONIN, ED    EKG EKG Interpretation  Date/Time:  Saturday January 07 2019 16:39:39 EST Ventricular Rate:  56 PR Interval:  138 QRS Duration: 96 QT Interval:  408 QTC Calculation: 393 R Axis:   -66 Text Interpretation:  Sinus bradycardia Left axis deviation T wave abnormality, consider inferolateral ischemia Abnormal ECG No previous ECGs available Confirmed by Vanetta Mulders [phone]) on 01/07/2019 4:44:31 PM   Radiology Dg Chest 2 View  Result Date: 01/07/2019 CLINICAL DATA:  Right-sided chest pain beginning this morning. Hypertension. EXAM: CHEST - 2 VIEW COMPARISON:  06/14/2012 FINDINGS: The heart size and mediastinal contours are within normal limits. Both lungs are clear. The visualized skeletal structures are unremarkable. IMPRESSION: No active cardiopulmonary disease. Electronically Signed   By: Myles Rosenthal M.D.   On: 01/07/2019 17:26    Procedures Procedures (including critical care time)  Medications Ordered in ED Medications  nitroGLYCERIN (NITROSTAT) SL tablet 0.4 mg (has no administration in time range)  sodium chloride flush (NS) 0.9 % injection 3 mL (3 mLs Intravenous Given 01/07/19 1642)     Initial Impression / Assessment and Plan / ED Course  I have reviewed the triage vital signs and the nursing notes.  Pertinent labs & imaging results that were available during my care of the patient were reviewed by me and considered in my  medical decision making (see chart for details).  Patient with persistent chest pain.  Initial work-up EKG without any acute changes there was some inferior lateral ischemia.  Patient's chest x-ray without any acute cardiopulmonary disease.  Patient's d-dimer and initial troponin were negative.  Patient will be admitted by the hospitalist team.  Initially patient did not want to be admitted but then he agreed.        Final Clinical Impressions(s) / ED Diagnoses   Final diagnoses:  Precordial pain    ED Discharge Orders    None       Vanetta Mulders, MD 01/08/19 2324

## 2019-01-07 NOTE — ED Triage Notes (Addendum)
Per EMS Pt from home with complaints of right sided CP 3/`10 that started this morning at 0700.  Pt does not have a history of CP but does have hypertension.  He checked his BP at home and repeatedly it was over 220.  He went to his local [facility] and they checked it where it still was elevated so they called ems.  Pt states being compliant to BP meds but did state that over christmas he was non compliant for 2 weeks.  Currently states no CP but does have back pain 6/10.  NAD noted at this time. Pt did receive 325 Asprin via EMS

## 2019-01-07 NOTE — ED Notes (Signed)
Patient transported to X-ray 

## 2019-01-07 NOTE — ED Notes (Signed)
Pt denying chest pain at this time. Reports that he is only experiencing back pain.

## 2019-01-08 ENCOUNTER — Observation Stay (HOSPITAL_BASED_OUTPATIENT_CLINIC_OR_DEPARTMENT_OTHER): Payer: 59

## 2019-01-08 DIAGNOSIS — R079 Chest pain, unspecified: Secondary | ICD-10-CM | POA: Diagnosis not present

## 2019-01-08 DIAGNOSIS — R072 Precordial pain: Secondary | ICD-10-CM | POA: Diagnosis not present

## 2019-01-08 DIAGNOSIS — I10 Essential (primary) hypertension: Secondary | ICD-10-CM | POA: Diagnosis not present

## 2019-01-08 DIAGNOSIS — R0789 Other chest pain: Secondary | ICD-10-CM | POA: Diagnosis not present

## 2019-01-08 LAB — HEMOGLOBIN A1C
Hgb A1c MFr Bld: 5.4 % (ref 4.8–5.6)
Mean Plasma Glucose: 108.28 mg/dL

## 2019-01-08 LAB — ECHOCARDIOGRAM COMPLETE
Height: 74 in
Weight: 4446.4 oz

## 2019-01-08 LAB — LIPID PANEL
Cholesterol: 167 mg/dL (ref 0–200)
HDL: 34 mg/dL — ABNORMAL LOW (ref >40)
LDL Cholesterol: 121 mg/dL — ABNORMAL HIGH (ref 0–99)
TRIGLYCERIDES: 62 mg/dL (ref <150)
Total CHOL/HDL Ratio: 4.9 RATIO
VLDL: 12 mg/dL (ref 0–40)

## 2019-01-08 LAB — HIV ANTIBODY (ROUTINE TESTING W REFLEX): HIV Screen 4th Generation wRfx: NONREACTIVE

## 2019-01-08 LAB — TROPONIN I
Troponin I: <0.03 ng/mL (ref <0.03)
Troponin I: <0.03 ng/mL (ref <0.03)

## 2019-01-08 NOTE — Consult Note (Addendum)
Cardiology Consult    Patient ID: Dylan Pace; [id number]; 1961-09-16   Admit date: 01/07/2019 Date of Consult: 01/08/2019  Primary Care Provider: Georgann Housekeeper, MD Primary Cardiologist: New to [facility] - Dr. Eden Emms  Patient Profile    Dylan Pace is a 58 y.o. male with past medical history of HTN and no prior cardiac history who is being seen today for the evaluation of chest pain at the request of Dr. Julian Reil.   History of Present Illness    Dylan Pace presented to [facility] ED on 01/07/2019 for evaluation of new-onset chest discomfort. In talking with the patient today, he reports being in his usual state of health until yesterday morning when he developed right-sided chest discomfort after moving a bed. His symptoms persisted throughout the day and upon checking his blood pressure at home his systolic reading was elevated to greater than 200. He went to a local fire department to have this rechecked and his SBP was elevated to 220.  He then went to to subsequent fire departments and obtained similar readings and EMS recommended he come to the hospital for further evaluation. Initial reading here was 160/98 and BP has improved to 125/87 this morning.  He reports still having right-sided discomfort which is worse when moving his right arm. No association with exertion. He denies any prior cardiac history. No known HLD or Type II DM. No known family history of CAD and he denies any prior tobacco use. He does not exercise regularly but denies any recent chest discomfort or dyspnea on exertion when carrying out his routine activities.  Initial labs showed WBC 6.2, Hgb 15.1, platelets 233, Na+ 139, K+ 3.8, and creatinine 1.32 (no prior labs available for comparison).  Initial and cyclic troponin values have been negative.  D-dimer negative.  CXR showed no active cardiopulmonary disease.  EKG shows sinus bradycardia, heart rate 56, with LAD and diffuse T wave inversion along the inferolateral  leads.   Past Medical History:  Diagnosis Date  . Hypertension     Past Surgical History:  Procedure Laterality Date  . VASECTOMY  1996     Home Medications:  Prior to Admission medications   Medication Sig Start Date End Date Taking? Authorizing Provider  losartan (COZAAR) 25 MG tablet Take 25 mg by mouth daily.   Yes [provider]    Inpatient Medications: Scheduled Meds: . enoxaparin (LOVENOX) injection  40 mg Subcutaneous Q24H  . losartan  25 mg Oral Daily   Continuous Infusions:  PRN Meds: acetaminophen, nitroGLYCERIN, ondansetron (ZOFRAN) IV  Allergies:    Allergies  Allergen Reactions  . Amoxicillin Rash  . Penicillins Rash    Social History:   Social History   Socioeconomic History  . Marital status: Married    Spouse name: Not on file  . Number of children: Not on file  . Years of education: Not on file  . Highest education level: Not on file  Occupational History  . Not on file  Social Needs  . Financial resource strain: Not on file  . Food insecurity:    Worry: Not on file    Inability: Not on file  . Transportation needs:    Medical: Not on file    Non-medical: Not on file  Tobacco Use  . Smoking status: Never Smoker  Substance and Sexual Activity  . Alcohol use: No    Alcohol/week: 0.0 standard drinks  . Drug use: Not on file  . Sexual activity:  Not on file  Lifestyle  . Physical activity:    Days per week: Not on file    Minutes per session: Not on file  . Stress: Not on file  Relationships  . Social connections:    Talks on phone: Not on file    Gets together: Not on file    Attends religious service: Not on file    Active member of club or organization: Not on file    Attends meetings of clubs or organizations: Not on file    Relationship status: Not on file  . Intimate partner violence:    Fear of current or ex partner: Not on file    Emotionally abused: Not on file    Physically abused: Not on file    Forced  sexual activity: Not on file  Other Topics Concern  . Not on file  Social History Narrative  . Not on file     Family History:    Family History  Problem Relation Age of Onset  . Brain cancer Mother   . Cancer - Prostate Father       Review of Systems    General:  No chills, fever, night sweats or weight changes.  Cardiovascular:  No dyspnea on exertion, edema, orthopnea, palpitations, paroxysmal nocturnal dyspnea. Positive for chest pain.  Dermatological: No rash, lesions/masses Respiratory: No cough, dyspnea Urologic: No hematuria, dysuria Abdominal:   No nausea, vomiting, diarrhea, bright red blood per rectum, melena, or hematemesis Neurologic:  No visual changes, wkns, changes in mental status. Positive for headaches (now resolved).   All other systems reviewed and are otherwise negative except as noted above.  Physical Exam/Data    Vitals:   01/07/19 1915 01/07/19 2000 01/07/19 2043 01/08/19 0529  BP: 113/87 131/75 (!) 164/88 125/87  Pulse: 70 61 (!) 57 63  Resp: (!) 21  13 13   Temp:   97.6 F (36.4 C) 97.7 F (36.5 C)  TempSrc:    Oral  SpO2: 95% 97% 100% 97%  Weight:    126.1 kg  Height:       No intake or output data in the 24 hours ending 01/08/19 0753 Filed Weights   01/07/19 1628 01/08/19 0529  Weight: 126.6 kg 126.1 kg   Body mass index is 35.68 kg/m.   General: Pleasant, African American male appearing in NAD. Psych: Normal affect. Neuro: Alert and oriented X 3. Moves all extremities spontaneously. HEENT: Normal  Neck: Supple without bruits or JVD. Lungs:  Resp regular and unlabored, CTA without wheezing or rales. Heart: RRR no s3, s4, or murmurs. Abdomen: Soft, non-tender, non-distended, BS + x 4.  Extremities: No clubbing, cyanosis or lower extremity edema. DP/PT/Radials 2+ and equal bilaterally.   EKG:  The EKG was personally reviewed and demonstrates: Sinus bradycardia, heart rate 56, with LAD and diffuse T wave inversion along the  inferolateral leads.  Telemetry:  Telemetry was personally reviewed and demonstrates: NSR, HR in 60's to 70's.    Labs/Studies     Relevant CV Studies:  Echocardiogram: Pending  Laboratory Data:  Chemistry Recent Labs  Lab 01/07/19 1625  NA 139  K 3.8  CL 102  CO2 25  GLUCOSE 90  BUN 24*  CREATININE 1.32*  CALCIUM 9.5  GFRNONAA 59*  GFRAA >60  ANIONGAP 12    No results for input(s): PROT, ALBUMIN, AST, ALT, ALKPHOS, BILITOT in the last 168 hours. Hematology Recent Labs  Lab 01/07/19 1625  WBC 6.2  RBC 5.39  HGB  15.1  HCT 45.4  MCV 84.2  MCH 28.0  MCHC 33.3  RDW 12.2  PLT 233   Cardiac Enzymes Recent Labs  Lab 01/07/19 1952 01/07/19 2352 01/08/19 0308  TROPONINI <0.03 <0.03 <0.03    Recent Labs  Lab 01/07/19 1635  TROPIPOC 0.03    BNPNo results for input(s): BNP, PROBNP in the last 168 hours.  DDimer  Recent Labs  Lab 01/07/19 1625  DDIMER 0.27    Radiology/Studies:  Dg Chest 2 View  Result Date: 01/07/2019 CLINICAL DATA:  Right-sided chest pain beginning this morning. Hypertension. EXAM: CHEST - 2 VIEW COMPARISON:  06/14/2012 FINDINGS: The heart size and mediastinal contours are within normal limits. Both lungs are clear. The visualized skeletal structures are unremarkable. IMPRESSION: No active cardiopulmonary disease. Electronically Signed   By: Myles RosenthalJohn  Stahl M.D.   On: 01/07/2019 17:26     Assessment & Plan    1. Atypical Chest Pain/ Abnormal EKG - The patient presented with right-sided chest discomfort which occurred after moving a bed. Symptoms have persisted for over 18 hours and are worse with arm movement. No association with exertion. - Initial and cyclic troponin values have been negative. D-dimer negative. EKG shows sinus bradycardia, heart rate 56, with LAD and diffuse T wave inversion along the inferolateral leads which no prior tracings available for comparison.  - Overall, his pain seems atypical for a cardiac etiology and  most consistent with musculoskeletal discomfort. An echocardiogram is pending to assess LV function and wall motion. If no significant abnormalities, would consider an outpatient stress test for further evaluation given his baseline abnormal EKG.  2. Accelerated HTN - He reported SBP had been greater than 200 when checked in the ambulatory setting. At 160/98 upon arrival and improved to 125/87 this morning. Continue PTA Losartan 25 mg daily. This can be further titrated as an outpatient if needed.  For questions or updates, please contact [facility] Please consult [email or web address] for contact info under Cardiology/STEMI.  Signed, Ellsworth LennoxBrittany M Strader, PA-C 01/08/2019, 7:53 AM Pager: [phone]   Patient examined chart reviewed atypical pain r/o ECG abnormal likely from HTN Improved this am. Exam with overweight black male S4 gallop clear lungs no edema Soft abdomen with no bruit  Ok to d/c home today BP improved can have outpatient  TTE to assess for LVH and cause of abnormal ECG no need for stress testing at this Time CXR with normal mediastinum  Charlton HawsPeter Hadassah Rana

## 2019-01-08 NOTE — Discharge Instructions (Signed)

## 2019-01-08 NOTE — Discharge Summary (Signed)
Physician Discharge Summary  JSON [name]:[id number] DOB: 1961-01-28 DOA: 01/07/2019  PCP: Georgann Housekeeper, MD  Admit date: 01/07/2019 Discharge date: 01/08/2019  Time spent: 45 minutes  Recommendations for Outpatient Follow-up:  Patient will be discharged to home.  Patient will need to follow up with primary care provider within one week of discharge.  Patient should continue medications as prescribed.  Patient should follow a heart healthy diet.   Discharge Diagnoses:  Principal Problem: Atypical chest pain/abnormal EKG Active Problems:   HTN (hypertension) Obesity  Discharge Condition: Stable  Diet recommendation: heart healthy  Filed Weights   01/07/19 1628 01/08/19 0529  Weight: 126.6 kg 126.1 kg    History of present illness:  On 01/07/2019 by Dr. Lyda Perone Dylan Pace is a 58 y.o. male with medical history significant of HTN.  Patient presents to the ED with c/o R sided CP.  Onset this AM at 0700.  Checked BP at home, was 200 systolic, went to [facility] checked BP there and it was 220.  He didn't believe it so went to 2nd [facility], BP checked and was still 190 or so.  EMS called.  On Losartan only.  Usually takes it except for 2 weeks over Xmas.  ASA 325 by EMS.  Hospital Course:  Atypical chest pain /abnormal EKG -Patient presented with right-sided chest discomfort as well as discomfort in his back.  He was moving a king-size bed yesterday when his symptoms started afterwards.  Symptoms persisted for quite some time and was worse with movement. -D-dimer unremarkable -EKG reviewed and showed sinus bradycardia with a rate of 56, LAD and diffuse T wave inversions along the inferior lateral leads.  Unfortunately no previous EKG for comparison. -Patient no longer having this chest discomfort. -Troponin cycled and found to be unremarkable -Lipid panel: Cholesterol 167, LDL 121, HDL 34, triglycerides 62 -Hemoglobin A1c 5.4 -Echocardiogram: EF of 55 to 60%, wall motion was  normal.  No regional wall motion abnormalities.  LV diastolic function is normal. -Cardiology consulted and appreciated recommended outpatient TTE.  No need for inpatient stress test.  Essential hypertension/accelerated hypertension -Patient reported that his systolic blood pressure was over 200 before coming to the emergency department.  This morning however his blood pressure has been stable -Continue losartan, follow-up with PCP for titration if needed  Obesity -BMI of 35.68.  Patient should follow-up with his PCP to discuss lifestyle modifications  Procedures: Echocardiogram  Consultations: Cardiology  Discharge Exam: Vitals:   01/07/19 2043 01/08/19 0529  BP: (!) 164/88 125/87  Pulse: (!) 57 63  Resp: 13 13  Temp: 97.6 F (36.4 C) 97.7 F (36.5 C)  SpO2: 100% 97%     General: Well developed, well nourished, NAD, appears stated age  HEENT: NCAT, mucous membranes moist.  Neck: Supple  Cardiovascular: S1 S2 auscultated, RRR, no murmur  Respiratory: Clear to auscultation bilaterally with equal chest rise  Abdomen: Soft, nontender, nondistended, + bowel sounds  Extremities: warm dry without cyanosis clubbing or edema  Neuro: AAOx3, nonfocal  Psych: Pleasant, appropriate mood and affect  Discharge Instructions Discharge Instructions    Discharge instructions   Complete by:  As directed    Patient will need to follow up with primary care provider within one week of discharge.  Patient should continue medications as prescribed.  Patient should follow a heart healthy diet.     Allergies as of 01/08/2019      Reactions   Amoxicillin Rash   Penicillins Rash  Medication List    TAKE these medications   losartan 25 MG tablet Commonly known as:  COZAAR Take 25 mg by mouth daily.      Allergies  Allergen Reactions  . Amoxicillin Rash  . Penicillins Rash   Follow-up Information    Georgann Housekeeper, MD. Schedule an appointment as soon as possible for a  visit in 1 week(s).   Specialty:  Internal Medicine Why:  Hospital follow up Contact information: [address] [place] [address] [phone]            The results of significant diagnostics from this hospitalization (including imaging, microbiology, ancillary and laboratory) are listed below for reference.    Significant Diagnostic Studies: Dg Chest 2 View  Result Date: 01/07/2019 CLINICAL DATA:  Right-sided chest pain beginning this morning. Hypertension. EXAM: CHEST - 2 VIEW COMPARISON:  06/14/2012 FINDINGS: The heart size and mediastinal contours are within normal limits. Both lungs are clear. The visualized skeletal structures are unremarkable. IMPRESSION: No active cardiopulmonary disease. Electronically Signed   By: Myles Rosenthal M.D.   On: 01/07/2019 17:26    Microbiology: No results found for this or any previous visit (from the past 240 hour(s)).   Labs: Basic Metabolic Panel: Recent Labs  Lab 01/07/19 1625  NA 139  K 3.8  CL 102  CO2 25  GLUCOSE 90  BUN 24*  CREATININE 1.32*  CALCIUM 9.5   Liver Function Tests: No results for input(s): AST, ALT, ALKPHOS, BILITOT, PROT, ALBUMIN in the last 168 hours. No results for input(s): LIPASE, AMYLASE in the last 168 hours. No results for input(s): AMMONIA in the last 168 hours. CBC: Recent Labs  Lab 01/07/19 1625  WBC 6.2  HGB 15.1  HCT 45.4  MCV 84.2  PLT 233   Cardiac Enzymes: Recent Labs  Lab 01/07/19 1952 01/07/19 2352 01/08/19 0308  TROPONINI <0.03 <0.03 <0.03   BNP: BNP (last 3 results) No results for input(s): BNP in the last 8760 hours.  ProBNP (last 3 results) No results for input(s): PROBNP in the last 8760 hours.  CBG: No results for input(s): GLUCAP in the last 168 hours.     Signed:  Edsel Petrin  [facility] 01/08/2019, 1:39 PM

## 2019-01-11 DIAGNOSIS — I10 Essential (primary) hypertension: Secondary | ICD-10-CM | POA: Diagnosis not present

## 2019-04-05 DIAGNOSIS — I10 Essential (primary) hypertension: Secondary | ICD-10-CM | POA: Diagnosis not present

## 2019-04-05 DIAGNOSIS — N182 Chronic kidney disease, stage 2 (mild): Secondary | ICD-10-CM | POA: Diagnosis not present

## 2019-04-05 DIAGNOSIS — J309 Allergic rhinitis, unspecified: Secondary | ICD-10-CM | POA: Diagnosis not present

## 2020-12-11 DIAGNOSIS — S61309A Unspecified open wound of unspecified finger with damage to nail, initial encounter: Secondary | ICD-10-CM | POA: Diagnosis not present

## 2021-01-02 DIAGNOSIS — Q846 Other congenital malformations of nails: Secondary | ICD-10-CM | POA: Diagnosis not present

## 2021-04-02 DIAGNOSIS — U071 COVID-19: Secondary | ICD-10-CM | POA: Diagnosis not present

## 2021-04-06 DIAGNOSIS — U071 COVID-19: Secondary | ICD-10-CM | POA: Diagnosis not present

## 2021-04-10 DIAGNOSIS — Z03818 Encounter for observation for suspected exposure to other biological agents ruled out: Secondary | ICD-10-CM | POA: Diagnosis not present

## 2021-09-22 DIAGNOSIS — Z1389 Encounter for screening for other disorder: Secondary | ICD-10-CM | POA: Diagnosis not present

## 2021-09-22 DIAGNOSIS — N182 Chronic kidney disease, stage 2 (mild): Secondary | ICD-10-CM | POA: Diagnosis not present

## 2021-09-22 DIAGNOSIS — I10 Essential (primary) hypertension: Secondary | ICD-10-CM | POA: Diagnosis not present

## 2021-09-22 DIAGNOSIS — E785 Hyperlipidemia, unspecified: Secondary | ICD-10-CM | POA: Diagnosis not present

## 2021-09-22 DIAGNOSIS — Z6837 Body mass index (BMI) 37.0-37.9, adult: Secondary | ICD-10-CM | POA: Diagnosis not present

## 2021-09-22 DIAGNOSIS — Z125 Encounter for screening for malignant neoplasm of prostate: Secondary | ICD-10-CM | POA: Diagnosis not present

## 2021-09-22 DIAGNOSIS — Z23 Encounter for immunization: Secondary | ICD-10-CM | POA: Diagnosis not present

## 2021-09-22 DIAGNOSIS — Z0000 Encounter for general adult medical examination without abnormal findings: Secondary | ICD-10-CM | POA: Diagnosis not present

## 2021-09-22 DIAGNOSIS — Z13220 Encounter for screening for lipoid disorders: Secondary | ICD-10-CM | POA: Diagnosis not present

## 2021-09-22 DIAGNOSIS — E669 Obesity, unspecified: Secondary | ICD-10-CM | POA: Diagnosis not present

## 2021-09-22 DIAGNOSIS — J309 Allergic rhinitis, unspecified: Secondary | ICD-10-CM | POA: Diagnosis not present

## 2022-04-17 DIAGNOSIS — D127 Benign neoplasm of rectosigmoid junction: Secondary | ICD-10-CM | POA: Diagnosis not present

## 2022-04-17 DIAGNOSIS — K648 Other hemorrhoids: Secondary | ICD-10-CM | POA: Diagnosis not present

## 2022-04-17 DIAGNOSIS — D122 Benign neoplasm of ascending colon: Secondary | ICD-10-CM | POA: Diagnosis not present

## 2022-04-17 DIAGNOSIS — K5730 Diverticulosis of large intestine without perforation or abscess without bleeding: Secondary | ICD-10-CM | POA: Diagnosis not present

## 2022-04-17 DIAGNOSIS — Z1211 Encounter for screening for malignant neoplasm of colon: Secondary | ICD-10-CM | POA: Diagnosis not present

## 2022-04-17 DIAGNOSIS — D124 Benign neoplasm of descending colon: Secondary | ICD-10-CM | POA: Diagnosis not present

## 2022-04-21 DIAGNOSIS — D127 Benign neoplasm of rectosigmoid junction: Secondary | ICD-10-CM | POA: Diagnosis not present

## 2022-04-21 DIAGNOSIS — D122 Benign neoplasm of ascending colon: Secondary | ICD-10-CM | POA: Diagnosis not present

## 2022-04-21 DIAGNOSIS — D124 Benign neoplasm of descending colon: Secondary | ICD-10-CM | POA: Diagnosis not present

## 2022-06-29 DIAGNOSIS — I10 Essential (primary) hypertension: Secondary | ICD-10-CM | POA: Diagnosis not present

## 2022-06-29 DIAGNOSIS — G4733 Obstructive sleep apnea (adult) (pediatric): Secondary | ICD-10-CM | POA: Diagnosis not present

## 2022-06-29 DIAGNOSIS — L819 Disorder of pigmentation, unspecified: Secondary | ICD-10-CM | POA: Diagnosis not present

## 2022-10-06 DIAGNOSIS — Z23 Encounter for immunization: Secondary | ICD-10-CM | POA: Diagnosis not present

## 2022-10-06 DIAGNOSIS — L609 Nail disorder, unspecified: Secondary | ICD-10-CM | POA: Diagnosis not present

## 2022-10-06 DIAGNOSIS — N1831 Chronic kidney disease, stage 3a: Secondary | ICD-10-CM | POA: Diagnosis not present

## 2022-10-06 DIAGNOSIS — Z1389 Encounter for screening for other disorder: Secondary | ICD-10-CM | POA: Diagnosis not present

## 2022-10-06 DIAGNOSIS — Z125 Encounter for screening for malignant neoplasm of prostate: Secondary | ICD-10-CM | POA: Diagnosis not present

## 2022-10-06 DIAGNOSIS — E785 Hyperlipidemia, unspecified: Secondary | ICD-10-CM | POA: Diagnosis not present

## 2022-10-06 DIAGNOSIS — Z6837 Body mass index (BMI) 37.0-37.9, adult: Secondary | ICD-10-CM | POA: Diagnosis not present

## 2022-10-06 DIAGNOSIS — G4733 Obstructive sleep apnea (adult) (pediatric): Secondary | ICD-10-CM | POA: Diagnosis not present

## 2022-10-06 DIAGNOSIS — I10 Essential (primary) hypertension: Secondary | ICD-10-CM | POA: Diagnosis not present

## 2022-10-06 DIAGNOSIS — J309 Allergic rhinitis, unspecified: Secondary | ICD-10-CM | POA: Diagnosis not present

## 2022-10-06 DIAGNOSIS — Z0000 Encounter for general adult medical examination without abnormal findings: Secondary | ICD-10-CM | POA: Diagnosis not present

## 2022-12-03 DIAGNOSIS — M9904 Segmental and somatic dysfunction of sacral region: Secondary | ICD-10-CM | POA: Diagnosis not present

## 2022-12-03 DIAGNOSIS — M9903 Segmental and somatic dysfunction of lumbar region: Secondary | ICD-10-CM | POA: Diagnosis not present

## 2022-12-03 DIAGNOSIS — M9905 Segmental and somatic dysfunction of pelvic region: Secondary | ICD-10-CM | POA: Diagnosis not present

## 2022-12-03 DIAGNOSIS — M545 Low back pain, unspecified: Secondary | ICD-10-CM | POA: Diagnosis not present

## 2023-03-31 DIAGNOSIS — L603 Nail dystrophy: Secondary | ICD-10-CM | POA: Diagnosis not present

## 2023-03-31 DIAGNOSIS — L03012 Cellulitis of left finger: Secondary | ICD-10-CM | POA: Diagnosis not present

## 2023-03-31 DIAGNOSIS — L810 Postinflammatory hyperpigmentation: Secondary | ICD-10-CM | POA: Diagnosis not present

## 2023-04-07 DIAGNOSIS — Z23 Encounter for immunization: Secondary | ICD-10-CM | POA: Diagnosis not present

## 2023-04-07 DIAGNOSIS — G4733 Obstructive sleep apnea (adult) (pediatric): Secondary | ICD-10-CM | POA: Diagnosis not present

## 2023-04-07 DIAGNOSIS — N1831 Chronic kidney disease, stage 3a: Secondary | ICD-10-CM | POA: Diagnosis not present

## 2023-04-07 DIAGNOSIS — Z6837 Body mass index (BMI) 37.0-37.9, adult: Secondary | ICD-10-CM | POA: Diagnosis not present

## 2023-04-07 DIAGNOSIS — I10 Essential (primary) hypertension: Secondary | ICD-10-CM | POA: Diagnosis not present

## 2023-10-13 DIAGNOSIS — Z6837 Body mass index (BMI) 37.0-37.9, adult: Secondary | ICD-10-CM | POA: Diagnosis not present

## 2023-10-13 DIAGNOSIS — Z1389 Encounter for screening for other disorder: Secondary | ICD-10-CM | POA: Diagnosis not present

## 2023-10-13 DIAGNOSIS — Z125 Encounter for screening for malignant neoplasm of prostate: Secondary | ICD-10-CM | POA: Diagnosis not present

## 2023-10-13 DIAGNOSIS — E785 Hyperlipidemia, unspecified: Secondary | ICD-10-CM | POA: Diagnosis not present

## 2023-10-13 DIAGNOSIS — Z13220 Encounter for screening for lipoid disorders: Secondary | ICD-10-CM | POA: Diagnosis not present

## 2023-10-13 DIAGNOSIS — Z23 Encounter for immunization: Secondary | ICD-10-CM | POA: Diagnosis not present

## 2023-10-13 DIAGNOSIS — N1831 Chronic kidney disease, stage 3a: Secondary | ICD-10-CM | POA: Diagnosis not present

## 2023-10-13 DIAGNOSIS — J309 Allergic rhinitis, unspecified: Secondary | ICD-10-CM | POA: Diagnosis not present

## 2023-10-13 DIAGNOSIS — G4733 Obstructive sleep apnea (adult) (pediatric): Secondary | ICD-10-CM | POA: Diagnosis not present

## 2023-10-13 DIAGNOSIS — Z0000 Encounter for general adult medical examination without abnormal findings: Secondary | ICD-10-CM | POA: Diagnosis not present

## 2023-10-13 DIAGNOSIS — I10 Essential (primary) hypertension: Secondary | ICD-10-CM | POA: Diagnosis not present

## 2024-06-22 DIAGNOSIS — L729 Follicular cyst of the skin and subcutaneous tissue, unspecified: Secondary | ICD-10-CM | POA: Diagnosis not present

## 2024-06-22 DIAGNOSIS — Z6838 Body mass index (BMI) 38.0-38.9, adult: Secondary | ICD-10-CM | POA: Diagnosis not present

## 2024-06-22 DIAGNOSIS — I10 Essential (primary) hypertension: Secondary | ICD-10-CM | POA: Diagnosis not present

## 2024-06-22 DIAGNOSIS — N1831 Chronic kidney disease, stage 3a: Secondary | ICD-10-CM | POA: Diagnosis not present

## 2024-10-13 DIAGNOSIS — Z1389 Encounter for screening for other disorder: Secondary | ICD-10-CM | POA: Diagnosis not present

## 2024-10-13 DIAGNOSIS — Z23 Encounter for immunization: Secondary | ICD-10-CM | POA: Diagnosis not present

## 2024-10-13 DIAGNOSIS — J309 Allergic rhinitis, unspecified: Secondary | ICD-10-CM | POA: Diagnosis not present

## 2024-10-13 DIAGNOSIS — K219 Gastro-esophageal reflux disease without esophagitis: Secondary | ICD-10-CM | POA: Diagnosis not present

## 2024-10-13 DIAGNOSIS — N1831 Chronic kidney disease, stage 3a: Secondary | ICD-10-CM | POA: Diagnosis not present

## 2024-10-13 DIAGNOSIS — E785 Hyperlipidemia, unspecified: Secondary | ICD-10-CM | POA: Diagnosis not present

## 2024-10-13 DIAGNOSIS — Z0000 Encounter for general adult medical examination without abnormal findings: Secondary | ICD-10-CM | POA: Diagnosis not present

## 2024-10-13 DIAGNOSIS — G4733 Obstructive sleep apnea (adult) (pediatric): Secondary | ICD-10-CM | POA: Diagnosis not present

## 2024-10-13 DIAGNOSIS — I10 Essential (primary) hypertension: Secondary | ICD-10-CM | POA: Diagnosis not present
